# Patient Record
Sex: Female | Born: 1975 | Race: Black or African American | Hispanic: No | Marital: Single | Smoking: Never smoker
Health system: Southern US, Community
[De-identification: ages and names within clinical notes are randomized; demographics above are authoritative.]

## PROBLEM LIST (undated history)

## (undated) DIAGNOSIS — I10 Essential (primary) hypertension: Secondary | ICD-10-CM

## (undated) HISTORY — PX: TUBAL LIGATION: SHX77

## (undated) HISTORY — PX: HAMMER TOE SURGERY: SHX385

---

## 2001-06-17 ENCOUNTER — Inpatient Hospital Stay (HOSPITAL_COMMUNITY): Admission: AD | Admit: 2001-06-17 | Discharge: 2001-06-17 | Payer: Self-pay | Admitting: Obstetrics & Gynecology

## 2004-03-20 ENCOUNTER — Ambulatory Visit (HOSPITAL_COMMUNITY): Admission: RE | Admit: 2004-03-20 | Discharge: 2004-03-20 | Payer: Self-pay | Admitting: Sports Medicine

## 2009-01-07 ENCOUNTER — Emergency Department (HOSPITAL_BASED_OUTPATIENT_CLINIC_OR_DEPARTMENT_OTHER): Admission: EM | Admit: 2009-01-07 | Discharge: 2009-01-07 | Payer: Self-pay | Admitting: Emergency Medicine

## 2009-04-26 ENCOUNTER — Emergency Department (HOSPITAL_BASED_OUTPATIENT_CLINIC_OR_DEPARTMENT_OTHER): Admission: EM | Admit: 2009-04-26 | Discharge: 2009-04-26 | Payer: Self-pay | Admitting: Emergency Medicine

## 2009-05-01 ENCOUNTER — Ambulatory Visit: Admission: RE | Admit: 2009-05-01 | Discharge: 2009-05-01 | Payer: Self-pay | Admitting: Emergency Medicine

## 2009-05-01 ENCOUNTER — Ambulatory Visit: Payer: Self-pay | Admitting: Diagnostic Radiology

## 2009-06-28 ENCOUNTER — Emergency Department (HOSPITAL_BASED_OUTPATIENT_CLINIC_OR_DEPARTMENT_OTHER): Admission: EM | Admit: 2009-06-28 | Discharge: 2009-06-28 | Payer: Self-pay | Admitting: Emergency Medicine

## 2009-07-18 ENCOUNTER — Emergency Department (HOSPITAL_BASED_OUTPATIENT_CLINIC_OR_DEPARTMENT_OTHER): Admission: EM | Admit: 2009-07-18 | Discharge: 2009-07-18 | Payer: Self-pay | Admitting: Emergency Medicine

## 2010-02-16 IMAGING — US US PELVIS COMPLETE
1 series · 14 of 25 positions shown · non-contrast
Comparison: None

CLINICAL DATA: Pelvic pain.

TRANSABDOMINAL AND TRANSVAGINAL ULTRASOUND OF PELVIS
TECHNIQUE: Both transabdominal and transvaginal ultrasound
examinations of the pelvis were performed including evaluation of
the uterus, ovaries, adnexal regions, and pelvic cul-de-sac.

[Series 1: us pelvis complete · 0.26mm/px · 14 of 74 slices shown]
[im 1/74]
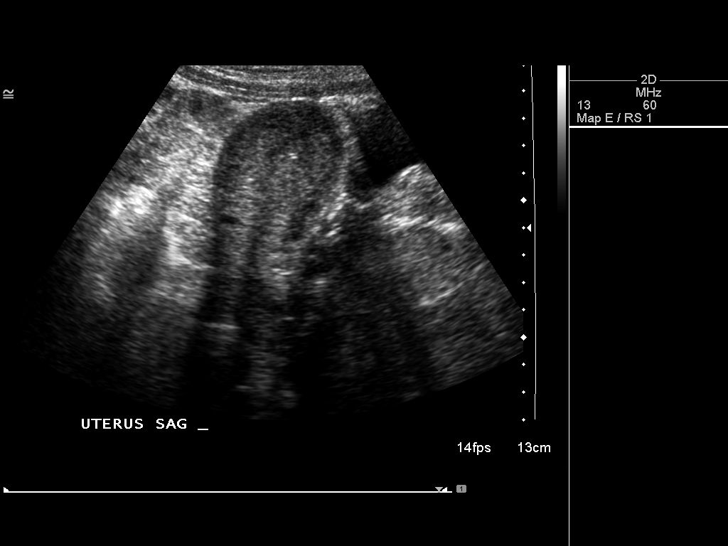
[im 7/74]
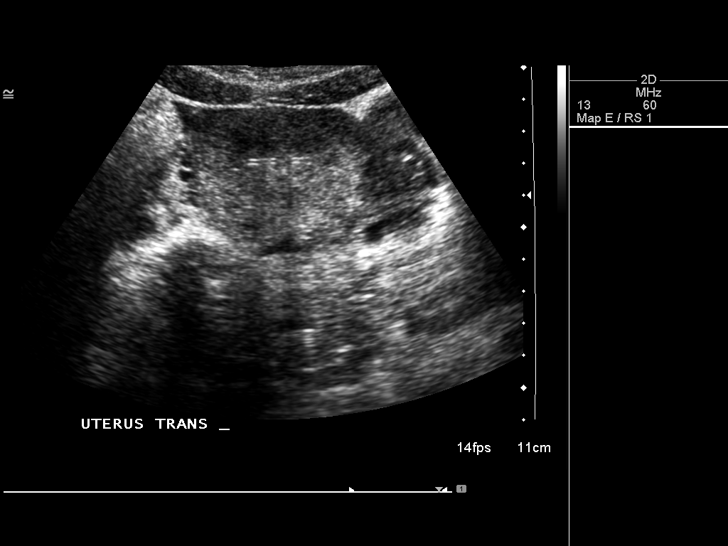
[im 13/74]
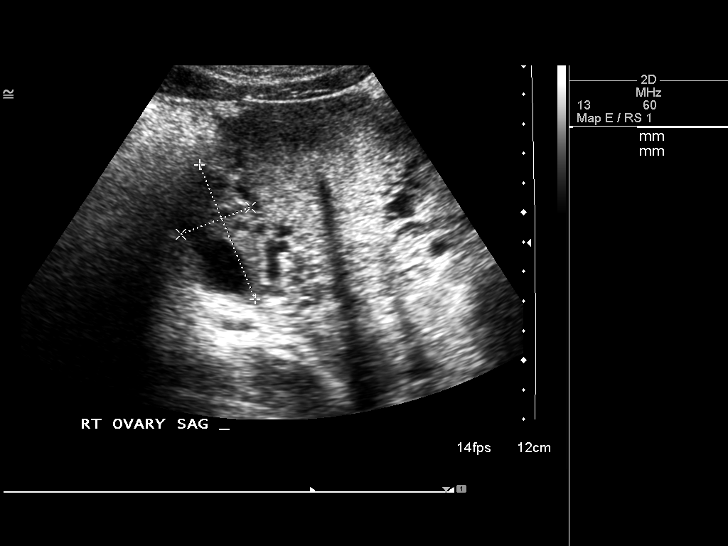
[im 19/74]
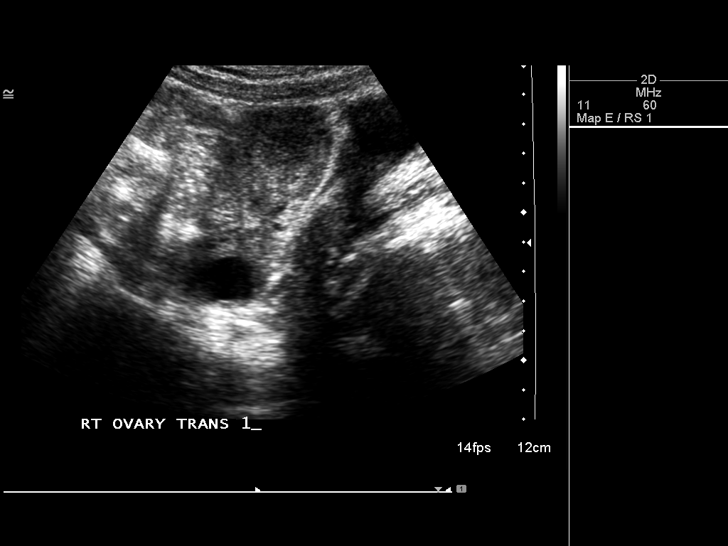
[im 25/74]
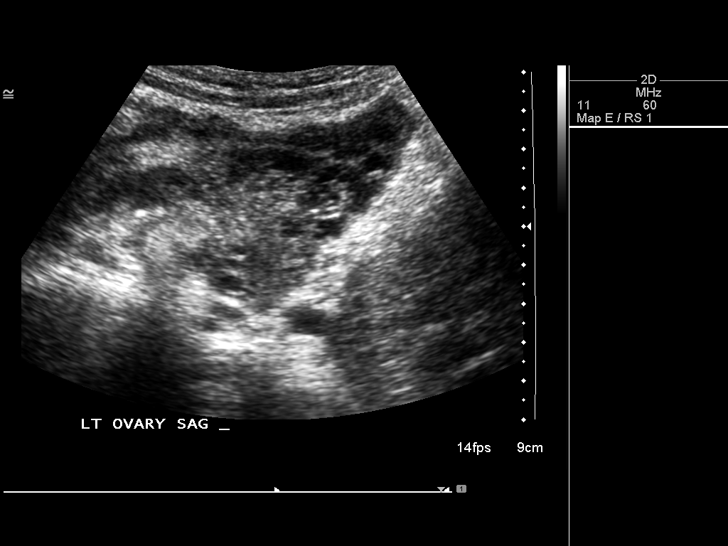
[im 28/74]
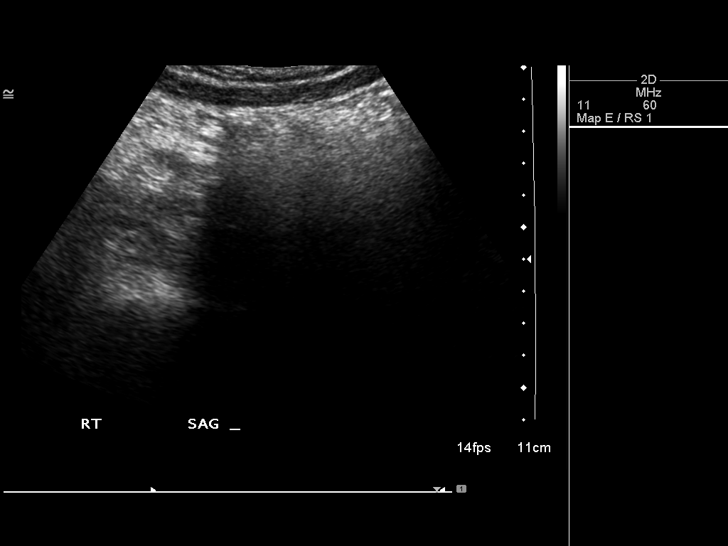
[im 34/74]
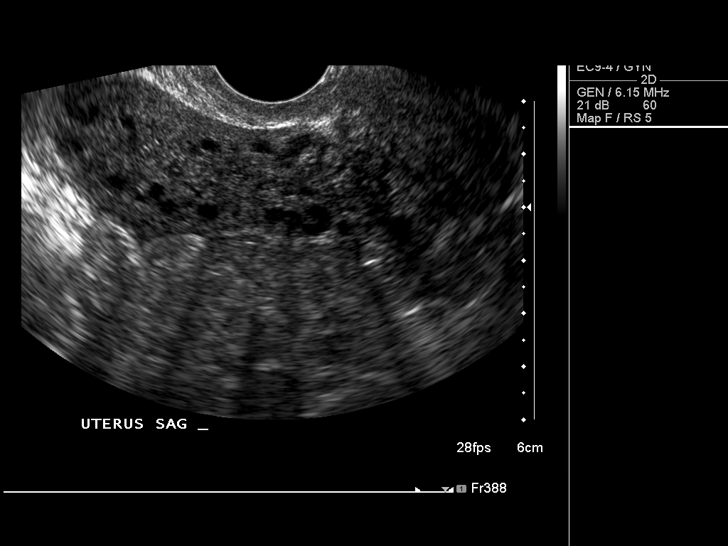
[im 40/74]
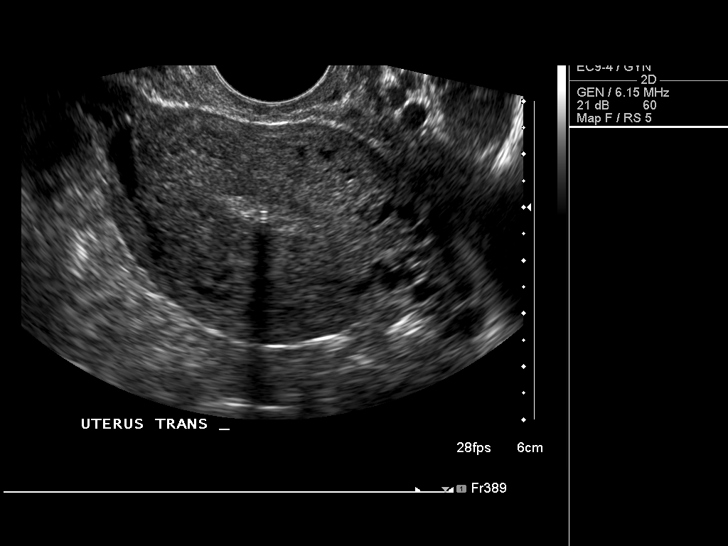
[im 46/74]
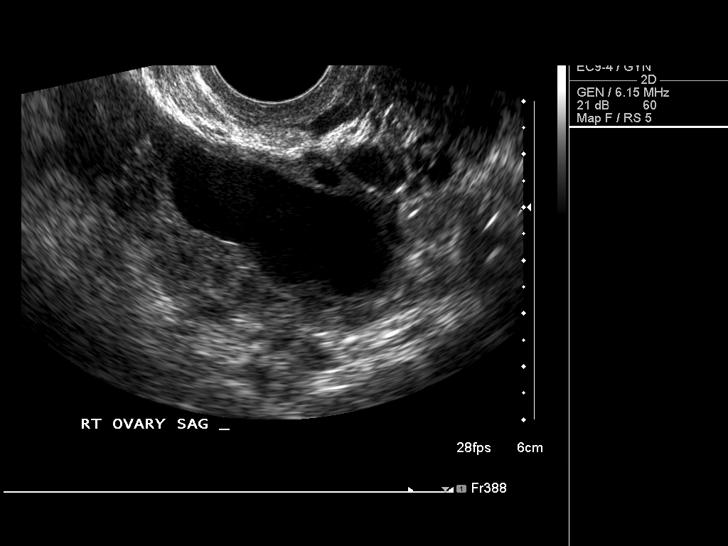
[im 49/74]
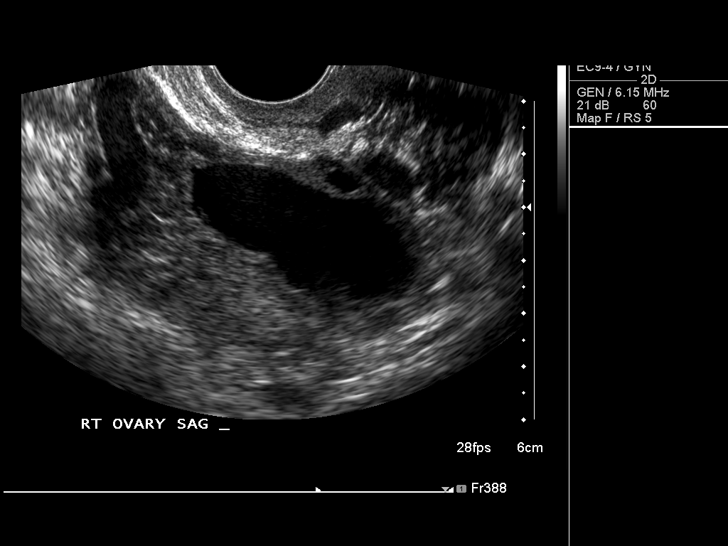
[im 55/74]
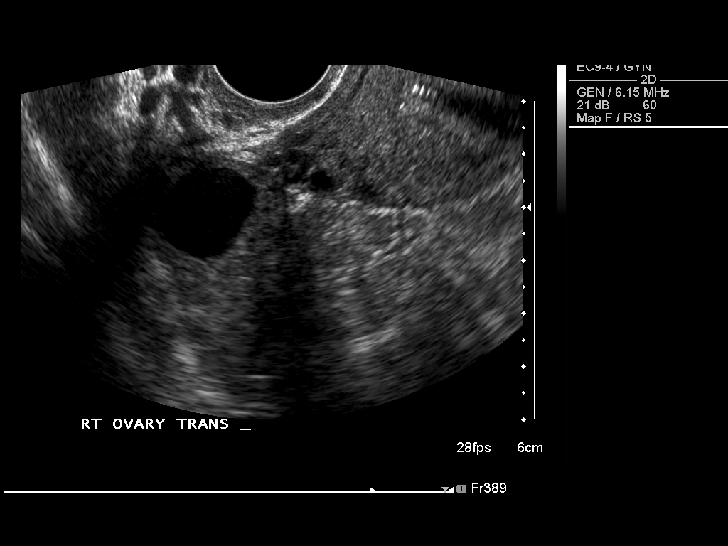
[im 61/74]
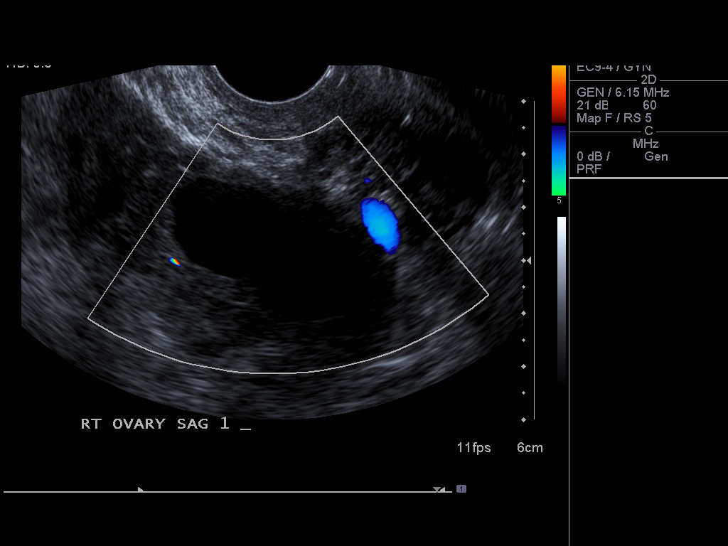
[im 67/74]
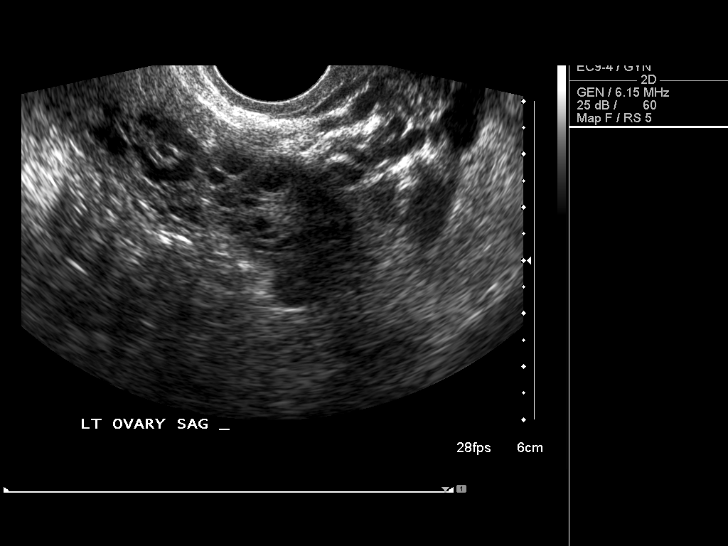
[im 74/74]
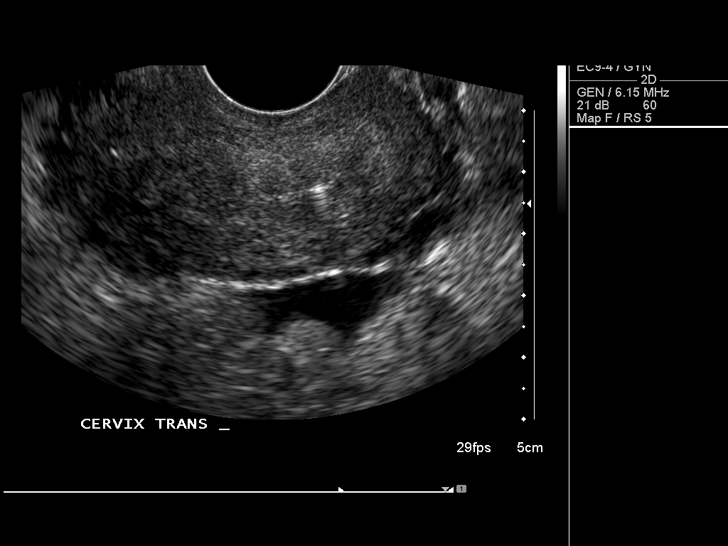

[14 of 25 positions shown; findings below may reference images not displayed]

FINDINGS: Uterus:  Measures 9.1 x 4.2 x 5.6 cm.  No myometrial abnormalities
are seen.

Endometrium:  An IUD is noted in the endometrial canal.  Normal
endometrial thickness at 5 mm.

Right Ovary:  Measures 5.2 x 2.9 x 2.6 cm.  There is a large cystic
structure associated with the right ovary which measures 4.5 x
x 1.9 cm.

Left Ovary:  Measures 2.9 x 2.0 x 2.2 cm.  No cysts or masses.

Other Findings:  Small amount of free pelvic fluid.
IMPRESSION: IUD is noted in the endometrial canal.
4.5 x 1.8 x 1.9 cm cyst associated the right ovary.  A follow-up
ultrasound examination in 3 months is suggested.
Small amount of free pelvic fluid.

## 2010-10-12 ENCOUNTER — Ambulatory Visit: Payer: Self-pay | Admitting: Family Medicine

## 2010-10-12 DIAGNOSIS — M25569 Pain in unspecified knee: Secondary | ICD-10-CM | POA: Insufficient documentation

## 2010-10-12 DIAGNOSIS — N949 Unspecified condition associated with female genital organs and menstrual cycle: Secondary | ICD-10-CM

## 2010-11-01 ENCOUNTER — Telehealth: Payer: Self-pay | Admitting: Family Medicine

## 2010-11-07 ENCOUNTER — Emergency Department (HOSPITAL_BASED_OUTPATIENT_CLINIC_OR_DEPARTMENT_OTHER)
Admission: EM | Admit: 2010-11-07 | Discharge: 2010-11-07 | Payer: Self-pay | Source: Home / Self Care | Admitting: Emergency Medicine

## 2010-11-09 ENCOUNTER — Telehealth: Payer: Self-pay | Admitting: Family Medicine

## 2010-11-16 ENCOUNTER — Telehealth: Payer: Self-pay | Admitting: Family Medicine

## 2010-12-28 NOTE — Progress Notes (Signed)
Summary: Req call from MD  Spoke to patient on phone.  She c/o decreased appetite.  Drinking plently of fluids, but does not have an appetite for solid foods.  Friend told her to try Megace.  Told patient that I do not feel this is indicated at this time.  I will look into this further and call her back with any suggestions.  At last visit, pt c/o DUB from Mirena but this has improved with a 1 mo. course of OCPs.  This decreased appetite may be 2/2 nausea from Mirena.  May need patient to come into clinic for evaluation.   Phone Note Call from Patient Call back at Home Phone [phone]   Reason for Call: Talk to Doctor Summary of Call: req call from MD, sts its personal Initial call taken by: Knox Royalty,  November 01, 2010 10:41 AM

## 2010-12-28 NOTE — Assessment & Plan Note (Signed)
Summary: meet new MD   Vital Signs:  Patient profile:   35 year old female Height:      67.5 inches Weight:      150 pounds BMI:     23.23 Temp:     99.4 degrees F oral Pulse rate:   76 / minute Pulse rhythm:   regular BP sitting:   146 / 89  (right arm) Cuff size:   regular  Vitals Entered By: Loralee Pacas CMA (October 12, 2010 2:06 PM) CC: new patient Comments right knee pain x 3 years on and off. pt uses merina for contraception but is now having issues with her menstration. placed 01/2009.   CC:  new patient.  History of Present Illness: 35 yo female presents to clinic to meet her new MD.  Wants to discuss 1) menorrhagia and Mirena, and 2) R knee pain.  1 - Pt had Mirena inserted 01/2009 by OB-GYN.  Her periods were regular for one year, no menorrhagia.  Then, 2 mo. went by without a period, and for the last 4 weeks, pt's has been bleeding intermittently.  At times, can be very heavy.  Wears a tampon, but no pads.  OB physician gave her Rx for OCP to try for one month to help regulate her cycle.  She has not filled them yet b/c wanted a second opinion.  Does endorse abdominal cramping.    2- Pt c/o R lateral knee pain intermittently for 3 years.  Only occurs when she is laying or sitting down with R knee bent.  Says it is painful when she tries to stand up, and resolves with stretching and walking.  Takes no medications for pain relief.   No associated symptoms.     ROS: Endorses menorrhagia, abdominal cramps, decreased appetite, R chronic knee pain.  Denies CP, SOB, N/V, fever.  Preventive Screening-Counseling & Management  Alcohol-Tobacco     Alcohol drinks/day: 0     Smoking Status: never  Caffeine-Diet-Exercise     Caffeine use/day: yes     Does Patient Exercise: yes     PHQ-9 Score: never     Depression Counseling: yes  Safety-Violence-Falls     Seat Belt Use: never  Current Medications (verified): 1)  Citalopram Hydrobromide 20 Mg Tabs (Citalopram  Hydrobromide) .... Take One Tablet Daily. 2)  Mirena 20 Mcg/24hr Iud (Levonorgestrel) .... Change Iud Q5 Years.  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 Non-contributory.  Mom and Dad both living, no medical problems.  Social History: Reviewed history and no changes required. Lives at home with son and daughter.  No pets.  Eats a healthy, balanced diet.  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  Single.  Currently sexually active with 1 partner for 11 years.  Non-smoker, denies alcohol use, denies recreational drug abuse.Alcohol drinks/day:  0 Occupation:  0 Residence:  yes Risk analyst Use:  never Does Patient Exercise:  yes Smoking Status:  never Caffeine use/day:  yes  Review of Systems       per HPI  Physical Exam  General:  in no acute distress; alert,appropriate and cooperative throughout examination Lungs:  Normal respiratory effort, chest expands symmetrically. Lungs are clear to auscultation, no crackles or wheezes. Heart:  Normal rate and regular rhythm. S1 and S2 normal without gallop, murmur, click, rub or  other extra sounds. Abdomen:  Bowel sounds positive,abdomen soft and non-tender without masses, organomegaly or hernias noted. Genitalia:  Normal introitus for age, no external lesions, no vaginal discharge, mucosa pink and moist, no vaginal or cervical lesions, no vaginal atrophy, no friaility or hemorrhage, normal uterus size and position, no adnexal masses or tenderness Msk:  No deformity or scoliosis noted of thoracic or lumbar spine.   Extremities:  No clubbing, cyanosis, edema, or deformity noted with normal full range of motion of all joints.     Impression & Recommendations:  Problem # 1:  DYSFUNCTIONAL UTERINE BLEEDING  (ICD-626.8) Assessment New No gross blood, tenderness, or masses found on pelvic exam.  Encouraged patient to take OCP's prescribed by [facility] physician to see if that improves her symptoms.  Told pt to take OCPs for one month.  If symptoms do not improve, may consider OCPs for 1-2 additional months.  If that still does not help, we may need to remove the IUD.  Patient agreed and understood with plan.  Pt to follow up in 1-2 months.  Problem # 2:  KNEE PAIN, RIGHT, CHRONIC (ICD-719.46) Assessment: New Not concerning for fracture or bony abnormality.  Most likely leg muscle or joint that is tight from staying in one position for too long.  Will not recommend any meds or further work-up at this point.  Advised pt that if it gets worse, we may consider imaging of R knee.  Advised pt to continue to exercise and stretch daily and to place warm compresses on affected knee.  Patient Instructions: 1)  It was great to meet you. 2)  Please take medications prescribed by OBGYN for 1 month. 3)  Please schedule a f/u appointment with me after taking OCPs for 1 month.  4)  Thank you. Prescriptions: MIRENA 20 MCG/24HR IUD (LEVONORGESTREL) change IUD q5 years.  inserted 01/2009  #1 x 0   Entered and Authorized by:   Ivy de Lawson Radar  MD   Signed by:   Barnabas Lister  MD on 10/12/2010   Method used:   Print then Give to Patient   RxID:   [id number]    Orders Added: 1)  [employer]- New Level 3 [99203]

## 2010-12-30 NOTE — Progress Notes (Signed)
Summary: return phone call  Called patient back.  She is to call and make an appointment with me in January to evaluate her change in appetite.    Phone Note Call from Patient   Caller: Patient Call For: [phone]  or [phone] Summary of Call: Pt requested a call back to her.  Msg was not received in time for patient to see primary today.  Pt did not receive a call at all and would have made an appt if provider had called her to ask her to do so.  Would like for Dr. Tye Savoy to return her call. Initial call taken by: Abundio Miu,  November 16, 2010 2:14 PM

## 2010-12-30 NOTE — Progress Notes (Signed)
  Patient will need to come to clinic for further work up.  I think I have an open slot next Tuesday. Phone Note Call from Patient   Caller: Patient Call For: [phone] Summary of Call: Pt requesting call back regarding last conversation she had with you.   Initial call taken by: Abundio Miu,  November 09, 2010 10:43 AM

## 2011-03-08 LAB — URINALYSIS, ROUTINE W REFLEX MICROSCOPIC
Bilirubin Urine: NEGATIVE
Glucose, UA: NEGATIVE mg/dL
Hgb urine dipstick: NEGATIVE
Specific Gravity, Urine: 1.029 (ref 1.005–1.030)
Urobilinogen, UA: 0.2 mg/dL (ref 0.0–1.0)
pH: 5.5 (ref 5.0–8.0)

## 2011-03-08 LAB — GC/CHLAMYDIA PROBE AMP, GENITAL
Chlamydia, DNA Probe: NEGATIVE
GC Probe Amp, Genital: NEGATIVE

## 2013-02-14 ENCOUNTER — Emergency Department (HOSPITAL_BASED_OUTPATIENT_CLINIC_OR_DEPARTMENT_OTHER)
Admission: EM | Admit: 2013-02-14 | Discharge: 2013-02-14 | Disposition: A | Discharge status: Home / Self Care | Payer: Medicaid Other | Attending: Emergency Medicine | Admitting: Emergency Medicine

## 2013-02-14 ENCOUNTER — Encounter (HOSPITAL_BASED_OUTPATIENT_CLINIC_OR_DEPARTMENT_OTHER): Payer: Self-pay | Admitting: *Deleted

## 2013-02-14 DIAGNOSIS — Y9389 Activity, other specified: Secondary | ICD-10-CM | POA: Insufficient documentation

## 2013-02-14 DIAGNOSIS — IMO0002 Reserved for concepts with insufficient information to code with codable children: Secondary | ICD-10-CM | POA: Insufficient documentation

## 2013-02-14 DIAGNOSIS — Y92410 Unspecified street and highway as the place of occurrence of the external cause: Secondary | ICD-10-CM | POA: Insufficient documentation

## 2013-02-14 MED ORDER — IBUPROFEN 800 MG PO TABS: 800.0000 mg | ORAL_TABLET | Freq: Three times a day (TID) | ORAL | Status: DC

## 2013-02-14 MED ORDER — IBUPROFEN 800 MG PO TABS
800.0000 mg | ORAL_TABLET | Freq: Once | ORAL | Status: AC
  Administered 2013-02-14: 800 mg via ORAL
  Filled 2013-02-14: qty 1

## 2013-02-14 MED ORDER — CYCLOBENZAPRINE HCL 10 MG PO TABS
5.0000 mg | ORAL_TABLET | Freq: Once | ORAL | Status: AC
Start: 2013-02-14 — End: 2013-02-14
  Administered 2013-02-14: 07:00:00 via ORAL
  Filled 2013-02-14: qty 1

## 2013-02-14 MED ORDER — CYCLOBENZAPRINE HCL 10 MG PO TABS: 10.0000 mg | ORAL_TABLET | Freq: Two times a day (BID) | ORAL | Status: DC | PRN
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
## 2013-02-14 NOTE — ED Notes (Signed)
Pt ambulated to room unassisted and with steady gait.

## 2013-07-24 ENCOUNTER — Emergency Department (HOSPITAL_BASED_OUTPATIENT_CLINIC_OR_DEPARTMENT_OTHER)
Admission: EM | Admit: 2013-07-24 | Discharge: 2013-07-24 | Disposition: A | Discharge status: Home / Self Care | Payer: No Typology Code available for payment source | Attending: Emergency Medicine | Admitting: Emergency Medicine

## 2013-07-24 ENCOUNTER — Encounter (HOSPITAL_BASED_OUTPATIENT_CLINIC_OR_DEPARTMENT_OTHER): Payer: Self-pay

## 2013-07-24 DIAGNOSIS — Y9289 Other specified places as the place of occurrence of the external cause: Secondary | ICD-10-CM | POA: Diagnosis not present

## 2013-07-24 DIAGNOSIS — IMO0002 Reserved for concepts with insufficient information to code with codable children: Secondary | ICD-10-CM | POA: Diagnosis present

## 2013-07-24 DIAGNOSIS — S335XXA Sprain of ligaments of lumbar spine, initial encounter: Secondary | ICD-10-CM | POA: Insufficient documentation

## 2013-07-24 DIAGNOSIS — Y9389 Activity, other specified: Secondary | ICD-10-CM | POA: Diagnosis not present

## 2013-07-24 DIAGNOSIS — S39012A Strain of muscle, fascia and tendon of lower back, initial encounter: Secondary | ICD-10-CM

## 2013-07-24 MED ORDER — CYCLOBENZAPRINE HCL 10 MG PO TABS: 10.0000 mg | ORAL_TABLET | Freq: Two times a day (BID) | ORAL | Status: DC | PRN
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
## 2013-12-29 ENCOUNTER — Emergency Department (HOSPITAL_BASED_OUTPATIENT_CLINIC_OR_DEPARTMENT_OTHER)
Admission: EM | Admit: 2013-12-29 | Discharge: 2013-12-29 | Disposition: A | Discharge status: Home / Self Care | Payer: Medicaid Other | Attending: Emergency Medicine | Admitting: Emergency Medicine

## 2013-12-29 ENCOUNTER — Encounter (HOSPITAL_BASED_OUTPATIENT_CLINIC_OR_DEPARTMENT_OTHER): Payer: Self-pay | Admitting: Emergency Medicine

## 2013-12-29 DIAGNOSIS — H6590 Unspecified nonsuppurative otitis media, unspecified ear: Secondary | ICD-10-CM

## 2013-12-29 DIAGNOSIS — Z791 Long term (current) use of non-steroidal anti-inflammatories (NSAID): Secondary | ICD-10-CM | POA: Insufficient documentation

## 2013-12-29 DIAGNOSIS — H6500 Acute serous otitis media, unspecified ear: Secondary | ICD-10-CM | POA: Insufficient documentation

## 2013-12-29 MED ORDER — PSEUDOEPHEDRINE HCL 30 MG PO TABS: 30.0000 mg | ORAL_TABLET | Freq: Four times a day (QID) | ORAL | Status: DC | PRN

## 2013-12-29 NOTE — ED Notes (Signed)
Rx x 1 given for sudafed-

## 2013-12-29 NOTE — Discharge Instructions (Signed)
Follow up with your doctor if symptoms persist. Take the medication as directed. Return here as needed.

## 2013-12-29 NOTE — ED Notes (Signed)
Right ear feels like a lot of pressure and is ringing

## 2013-12-29 NOTE — ED Provider Notes (Signed)
CSN: [id number]     Arrival date & time 12/29/13  1540 History   First MD Initiated Contact with Patient 12/29/13 1542     Chief Complaint  Patient presents with  . Otalgia   (Consider location/radiation/quality/duration/timing/severity/associated sxs/prior Treatment) Patient is a 38 y.o. female presenting with ear pain. The history is provided by the patient.  Otalgia Location:  Right Quality:  Pressure Severity:  Moderate Onset quality:  Gradual Timing:  Constant Progression:  Worsening  Brittany DowKatina N Harding is a 38 y.o. female who presents to the ED with a feeling of pressure in her right ear. She states that it does not hurt but just has pressure. The symptoms started a couple days ago and have continued. She denies any other problems tonight.    History reviewed. No pertinent past medical history. History reviewed. No pertinent past surgical history. No family history on file. History  Substance Use Topics  . Smoking status: Never Smoker   . Smokeless tobacco: Not on file  . Alcohol Use: No   OB History   Grav Para Term Preterm Abortions TAB SAB Ect Mult Living                 Review of Systems   Negative except as stated in HPI Allergies  Review of patient's allergies indicates no known allergies.  Home Medications   Current Outpatient Rx  Name  Route  Sig  Dispense  Refill  . cyclobenzaprine (FLEXERIL) 10 MG tablet   Oral   Take 1 tablet (10 mg total) by mouth 2 (two) times daily as needed for muscle spasms.   20 tablet   0   . cyclobenzaprine (FLEXERIL) 10 MG tablet   Oral   Take 1 tablet (10 mg total) by mouth 2 (two) times daily as needed for muscle spasms.   20 tablet   0   . ibuprofen (ADVIL,MOTRIN) 800 MG tablet   Oral   Take 1 tablet (800 mg total) by mouth 3 (three) times daily.   21 tablet   0    BP 123/79  Pulse 98  Temp(Src) 98.7 F (37.1 C) (Oral)  Resp 16  Ht 5\' 6"  (1.676 m)  Wt 140 lb (63.504 kg)  BMI 22.61 kg/m2  SpO2  100% Physical Exam  Nursing note and vitals reviewed. Constitutional: She is oriented to person, place, and time. She appears well-developed and well-nourished. No distress.  HENT:  Left Ear: Tympanic membrane normal.  Right serous otitis  Eyes: Conjunctivae and EOM are normal.  Neck: Neck supple.  Cardiovascular: Normal rate and regular rhythm.   Pulmonary/Chest: Effort normal and breath sounds normal.  Musculoskeletal: Normal range of motion.  Neurological: She is alert and oriented to person, place, and time. No cranial nerve deficit.  Skin: Skin is warm and dry.  Psychiatric: She has a normal mood and affect. Her behavior is normal.    ED Course  Procedures  MDM  38 y.o. female with right serous otitis. Will treat with decongestants and patient will follow up with her PCP.  Discussed with the patient and all questioned fully answered. She will return here for worsening symptoms or problems.    Medication List    TAKE these medications       pseudoephedrine 30 MG tablet  Commonly known as:  SUDAFED  Take 1 tablet (30 mg total) by mouth every 6 (six) hours as needed for congestion.      ASK your doctor about these medications  cyclobenzaprine 10 MG tablet  Commonly known as:  FLEXERIL  Take 1 tablet (10 mg total) by mouth 2 (two) times daily as needed for muscle spasms.     cyclobenzaprine 10 MG tablet  Commonly known as:  FLEXERIL  Take 1 tablet (10 mg total) by mouth 2 (two) times daily as needed for muscle spasms.     ibuprofen 800 MG tablet  Commonly known as:  ADVIL,MOTRIN  Take 1 tablet (800 mg total) by mouth 3 (three) times daily.         [place], [place] 12/29/13 2126

## 2014-01-01 NOTE — ED Provider Notes (Signed)
Medical screening examination/treatment/procedure(s) were performed by non-physician practitioner and as supervising physician I was immediately available for consultation/collaboration.  EKG Interpretation   None         Eman Rynders, MD 01/01/14 0713 

## 2018-07-13 ENCOUNTER — Emergency Department (HOSPITAL_BASED_OUTPATIENT_CLINIC_OR_DEPARTMENT_OTHER)
Admission: EM | Admit: 2018-07-13 | Discharge: 2018-07-13 | Disposition: A | Discharge status: Home / Self Care | Attending: Emergency Medicine | Admitting: Emergency Medicine

## 2018-07-13 ENCOUNTER — Encounter (HOSPITAL_BASED_OUTPATIENT_CLINIC_OR_DEPARTMENT_OTHER): Payer: Self-pay | Admitting: *Deleted

## 2018-07-13 ENCOUNTER — Other Ambulatory Visit: Payer: Self-pay

## 2018-07-13 DIAGNOSIS — S60414A Abrasion of right ring finger, initial encounter: Secondary | ICD-10-CM | POA: Diagnosis not present

## 2018-07-13 DIAGNOSIS — Z79899 Other long term (current) drug therapy: Secondary | ICD-10-CM | POA: Diagnosis not present

## 2018-07-13 DIAGNOSIS — I10 Essential (primary) hypertension: Secondary | ICD-10-CM | POA: Diagnosis not present

## 2018-07-13 DIAGNOSIS — Y9289 Other specified places as the place of occurrence of the external cause: Secondary | ICD-10-CM | POA: Diagnosis not present

## 2018-07-13 DIAGNOSIS — Y9389 Activity, other specified: Secondary | ICD-10-CM | POA: Diagnosis not present

## 2018-07-13 DIAGNOSIS — W5329XA Other contact with squirrel, initial encounter: Secondary | ICD-10-CM | POA: Insufficient documentation

## 2018-07-13 DIAGNOSIS — S6991XA Unspecified injury of right wrist, hand and finger(s), initial encounter: Secondary | ICD-10-CM | POA: Diagnosis present

## 2018-07-13 DIAGNOSIS — Y990 Civilian activity done for income or pay: Secondary | ICD-10-CM | POA: Insufficient documentation

## 2018-07-13 HISTORY — DX: Essential (primary) hypertension: I10

## 2018-07-13 MED ORDER — BACITRACIN ZINC 500 UNIT/GM EX OINT: TOPICAL_OINTMENT | Freq: Two times a day (BID) | CUTANEOUS | Status: DC
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
## 2018-07-13 NOTE — ED Provider Notes (Signed)
[facility] EMERGENCY DEPARTMENT Provider Note   CSN: [id number] Arrival date & time: 07/13/18  1559     History   Chief Complaint Chief Complaint  Patient presents with  . Finger Injury    HPI Ihor DowKatina N Eynon is a 42 y.o. female with a history of hypertension who presents to the emergency department with a chief complaint of right finger wound.  The patient reports that she was eating lunch at work when she went to throw her plate away in a trash can and felt a prick on her right ring finger.  Then states that a squirrel leapt out of the trash can.  She was unsure if the wound on her right finger finger was from the squirrels claw or tooth.  She reports the wound initially had a couple of drops of blood, but is now hemostatic.  No treatment prior to arrival.  She denies numbness or weakness.  No history of diabetes mellitus, HIV, or other immunocompromised states.  The history is provided by the patient. No language interpreter was used.    Past Medical History:  Diagnosis Date  . Hypertension     Patient Active Problem List   Diagnosis Date Noted  . 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 KNEE PAIN, RIGHT, CHRONIC 10/12/2010    Past Surgical History:  Procedure Laterality Date  . HAMMER TOE SURGERY    . TUBAL LIGATION       OB History   None      Home Medications    Prior to Admission medications   Medication Sig Start Date End Date Taking? Authorizing Provider  hydrochlorothiazide (MICROZIDE) 12.5 MG capsule Take 12.5 mg by mouth daily.   Yes [provider]    Family History History reviewed. No pertinent family history.  Social History Social History   Tobacco Use  . Smoking status: Never Smoker  . Smokeless tobacco: Never Used  Substance Use Topics  . Alcohol use: No  . Drug use: Never     Allergies   Patient has no known allergies.   Review of Systems Review of Systems  Constitutional: Negative for activity  change.  Respiratory: Negative for shortness of breath.   Cardiovascular: Negative for chest pain.  Gastrointestinal: Negative for abdominal pain.  Genitourinary: Negative for dysuria.  Musculoskeletal: Negative for back pain.  Skin: Positive for wound. Negative for rash.  Allergic/Immunologic: Negative for immunocompromised state.  Neurological: Negative for weakness, numbness and headaches.  Psychiatric/Behavioral: Negative for confusion.     Physical Exam Updated Vital Signs BP (!) 142/96 (BP Location: Left Arm)   Pulse 86   Temp 98.6 F (37 C) (Oral)   Resp 16   Ht 5\' 7"  (1.702 m)   Wt 78 kg   LMP 07/04/2018   SpO2 99%   BMI 26.94 kg/m   Physical Exam  Constitutional: No distress.  HENT:  Head: Normocephalic.  Eyes: Conjunctivae are normal.  Neck: Neck supple.  Cardiovascular: Normal rate and regular rhythm. Exam reveals no gallop and no friction rub.  No murmur heard. Pulmonary/Chest: Effort normal. No respiratory distress.  Abdominal: Soft. She exhibits no distension.  Neurological: She is alert.  Skin: Skin is warm. No rash noted.  There is a 1 mm superficial break in the skin/abrasion to the middle phalanx.  Wound is hemostatic.  No surrounding erythema, edema, or warmth.  Psychiatric: Her behavior is normal.  Nursing note and vitals reviewed.    ED Treatments / Results  Labs (all  labs ordered are listed, but only abnormal results are displayed) Labs Reviewed - No data to display  EKG None  Radiology No results found.  Procedures Procedures (including critical care time)  Medications Ordered in ED Medications  bacitracin ointment (has no administration in time range)     Initial Impression / Assessment and Plan / ED Course  I have reviewed the triage vital signs and the nursing notes.  Pertinent labs & imaging results that were available during my care of the patient were reviewed by me and considered in my medical decision making (see chart  for details).     42 year old female with a history of hypertension presenting to the emergency department with a superficial abrasion to the right ring finger after she came in contact with a squirrel earlier today while at work.  States that she felt a prick while throwing away her lunch plate and then the squirrel popped out of the trash can.  She is unsure if the wound came from contact with the squirrels teeth or claws.  On exam, the patient has a superficial abrasion that is approximately 1 mm in size.  The skin appears intact, but the patient states there were a few drops of blood after the wound initially occurred.  She is hemodynamically stable with no history of HIV, diabetes mellitus, or other immune compromised states.  Patient was discussed with Dr. Erma HeritageIsaacs, attending physician.  The CDC indicates that small rodents such as squirrels are found to almost never be infected with rabies and no known cases of transmission of rabies to humans are known.  Wound care provided in the ED along with bacitracin.  Strict return precautions to the emergency department given.  She is hemodynamically stable and in no acute distress.  She is safe for discharge home with outpatient follow-up at this time.  Final Clinical Impressions(s) / ED Diagnoses   Final diagnoses:  Other contact with squirrel, initial encounter    ED Discharge Orders    None       Barkley BoardsMcDonald, Ellysa Parrack A, PA-C 07/13/18 1710    Shaune PollackIsaacs, Cameron, MD 07/13/18 2339

## 2018-07-13 NOTE — Discharge Instructions (Signed)
Thank you for allowing me to care for you today in the Emergency Department.   Keep your wound clean by washing the area at least once daily with warm water and soap.  You can apply a topical antibiotic such as bacitracin or Neosporin.  Avoid submerging the finger and hot tubs, pools, or the ocean until the wound fully closes.  Return to the emergency department if the area around the wound gets red, hot to the touch, swollen, or if you develop fever, or chills.

## 2024-12-19 ENCOUNTER — Ambulatory Visit: Admitting: Podiatry
